# Patient Record
Sex: Male | Born: 1965 | Race: Asian | Hispanic: No | Marital: Married | Smoking: Never smoker
Health system: Southern US, Community
[De-identification: ages and names within clinical notes are randomized; demographics above are authoritative.]

## PROBLEM LIST (undated history)

## (undated) DIAGNOSIS — I10 Essential (primary) hypertension: Secondary | ICD-10-CM

## (undated) HISTORY — PX: WISDOM TOOTH EXTRACTION: SHX21

## (undated) HISTORY — PX: HERNIA REPAIR: SHX51

---

## 2019-07-16 ENCOUNTER — Other Ambulatory Visit: Payer: Self-pay

## 2019-07-16 DIAGNOSIS — Z20822 Contact with and (suspected) exposure to covid-19: Secondary | ICD-10-CM

## 2019-07-18 LAB — NOVEL CORONAVIRUS, NAA: SARS-CoV-2, NAA: NOT DETECTED

## 2019-11-17 ENCOUNTER — Ambulatory Visit: Payer: Self-pay

## 2021-07-01 ENCOUNTER — Ambulatory Visit: Payer: Self-pay | Admitting: Orthopedic Surgery

## 2021-07-26 ENCOUNTER — Encounter (HOSPITAL_COMMUNITY): Payer: Self-pay

## 2021-07-26 NOTE — Patient Instructions (Addendum)
DUE TO COVID-19 ONLY ONE VISITOR IS ALLOWED TO COME WITH YOU AND STAY IN THE WAITING ROOM ONLY DURING PRE OP AND PROCEDURE.   **NO VISITORS ARE ALLOWED IN THE SHORT STAY AREA OR RECOVERY ROOM!!**  IF YOU WILL BE ADMITTED INTO THE HOSPITAL YOU ARE ALLOWED ONLY TWO SUPPORT PEOPLE DURING VISITATION HOURS ONLY (7 AM -8PM)    Up to two visitors ages 38+ are allowed at one time in a patient's room.  The visitors may rotate out with other people throughout the day.  Additionally, up to two children between the ages of 58 and 45 are allowed and do not count toward the number of allowed visitors.  Children within this age range must be accompanied by an adult visitor.  One adult visitor may remain with the patient overnight and must be in the room by 8 PM.  COVID SWAB TESTING MUST BE COMPLETED ON:  Wednesday, 08-11-21, Between the hours of 8 and 3  **MUST PRESENT COMPLETED FORM AT TESTING SITE**    [address] [place] (backside of the building)  You are not required to quarantine, however you are required to wear a well-fitted mask when you are out and around people not in your household.  Hand Hygiene often Do NOT share personal items Notify your provider if you are in close contact with someone who has COVID or you develop fever 100.4 or greater, new onset of sneezing, cough, sore throat, shortness of breath or body aches.        Your procedure is scheduled on:  Friday, 08-13-21   Report to [facility] Main  Entrance     Report to admitting at 11:00 AM   Call this number if you have problems the morning of surgery [phone]   Do not eat food :After Midnight.   May have liquids until 10:15 AM day of surgery  CLEAR LIQUID DIET  Foods Allowed                                                                     Foods Excluded  Water, Black Coffee (no milk/no creamer) and tea, regular and decaf                              liquids that you cannot  Plain Jell-O in  any flavor  (No red)                         see through such as: Fruit ices (not with fruit pulp)                                 milk, soups, orange juice  Iced Popsicles (No red)                                    All solid food                             Apple juices  Sports drinks like Gatorade (No red) Lightly seasoned clear broth or consume(fat free) Sugar    Complete one Ensure drink the morning of surgery at  10:15 AM the day of surgery.     The day of surgery:  Drink ONE (1) Pre-Surgery Clear Ensure the morning of surgery. Drink in one sitting. Do not sip.  This drink was given to you during your hospital  pre-op appointment visit. Nothing else to drink after completing the Pre-Surgery Clear Ensure .          If you have questions, please contact your surgeon's office.     Oral Hygiene is also important to reduce your risk of infection.                                    Remember - BRUSH YOUR TEETH THE MORNING OF SURGERY WITH YOUR REGULAR TOOTHPASTE   Do NOT smoke after Midnight   Take these medicines the morning of surgery with A SIP OF WATER:  Metoprolol   Stop all vitamins and herbal supplements a week before surgery             You may not have any metal on your body including  jewelry, and body piercing             Do not wear  lotions, powders, cologne, or deodorant  Do not shave  48 hours prior to surgery.               Men may shave face and neck.  Do not bring valuables to the hospital. [facility] IS NOT RESPONSIBLE FOR VALUABLES.   Contacts, dentures or bridgework may not be worn into surgery.   Bring small overnight bag day of surgery.   Please read over the following fact sheets you were given: IF YOU HAVE QUESTIONS ABOUT YOUR PRE OP INSTRUCTIONS PLEASE CALL [phone] [facility] - Preparing for Surgery Before surgery, you can play an important role.  Because skin is not sterile, your skin needs to be as free of germs as possible.  You  can reduce the number of germs on your skin by washing with CHG (chlorahexidine gluconate) soap before surgery.  CHG is an antiseptic cleaner which kills germs and bonds with the skin to continue killing germs even after washing. Please DO NOT use if you have an allergy to CHG or antibacterial soaps.  If your skin becomes reddened/irritated stop using the CHG and inform your nurse when you arrive at Short Stay. Do not shave (including legs and underarms) for at least 48 hours prior to the first CHG shower.  You may shave your face/neck.  Please follow these instructions carefully:  1.  Shower with CHG Soap the night before surgery and the  morning of surgery.  2.  If you choose to wash your hair, wash your hair first as usual with your normal  shampoo.  3.  After you shampoo, rinse your hair and body thoroughly to remove the shampoo.                             4.  Use CHG as you would any other liquid soap.  You can apply chg directly to the skin and wash.  Gently with a scrungie or clean washcloth.  5.  Apply the CHG Soap to your body  ONLY FROM THE NECK DOWN.   Do   not use on face/ open                           Wound or open sores. Avoid contact with eyes, ears mouth and   genitals (private parts).                       Wash face,  Genitals (private parts) with your normal soap.             6.  Wash thoroughly, paying special attention to the area where your    surgery  will be performed.  7.  Thoroughly rinse your body with warm water from the neck down.  8.  DO NOT shower/wash with your normal soap after using and rinsing off the CHG Soap.                9.  Pat yourself dry with a clean towel.            10.  Wear clean pajamas.            11.  Place clean sheets on your bed the night of your first shower and do not  sleep with pets. Day of Surgery : Do not apply any lotions/deodorants the morning of surgery.  Please wear clean clothes to the hospital/surgery center.  FAILURE TO FOLLOW  THESE INSTRUCTIONS MAY RESULT IN THE CANCELLATION OF YOUR SURGERY  PATIENT SIGNATURE_________________________________  NURSE SIGNATURE__________________________________  ________________________________________________________________________   Rogelia Mire  An incentive spirometer is a tool that can help keep your lungs clear and active. This tool measures how well you are filling your lungs with each breath. Taking long deep breaths may help reverse or decrease the chance of developing breathing (pulmonary) problems (especially infection) following: A long period of time when you are unable to move or be active. BEFORE THE PROCEDURE  If the spirometer includes an indicator to show your best effort, your nurse or respiratory therapist will set it to a desired goal. If possible, sit up straight or lean slightly forward. Try not to slouch. Hold the incentive spirometer in an upright position. INSTRUCTIONS FOR USE  Sit on the edge of your bed if possible, or sit up as far as you can in bed or on a chair. Hold the incentive spirometer in an upright position. Breathe out normally. Place the mouthpiece in your mouth and seal your lips tightly around it. Breathe in slowly and as deeply as possible, raising the piston or the ball toward the top of the column. Hold your breath for 3-5 seconds or for as long as possible. Allow the piston or ball to fall to the bottom of the column. Remove the mouthpiece from your mouth and breathe out normally. Rest for a few seconds and repeat Steps 1 through 7 at least 10 times every 1-2 hours when you are awake. Take your time and take a few normal breaths between deep breaths. The spirometer may include an indicator to show your best effort. Use the indicator as a goal to work toward during each repetition. After each set of 10 deep breaths, practice coughing to be sure your lungs are clear. If you have an incision (the cut made at the time of surgery),  support your incision when coughing by placing a pillow or rolled up towels firmly against it. Once you are able to get out  of bed, walk around indoors and cough well. You may stop using the incentive spirometer when instructed by your caregiver.  RISKS AND COMPLICATIONS Take your time so you do not get dizzy or light-headed. If you are in pain, you may need to take or ask for pain medication before doing incentive spirometry. It is harder to take a deep breath if you are having pain. AFTER USE Rest and breathe slowly and easily. It can be helpful to keep track of a log of your progress. Your caregiver can provide you with a simple table to help with this. If you are using the spirometer at home, follow these instructions: SEEK MEDICAL CARE IF:  You are having difficultly using the spirometer. You have trouble using the spirometer as often as instructed. Your pain medication is not giving enough relief while using the spirometer. You develop fever of 100.5 F (38.1 C) or higher. SEEK IMMEDIATE MEDICAL CARE IF:  You cough up bloody sputum that had not been present before. You develop fever of 102 F (38.9 C) or greater. You develop worsening pain at or near the incision site. MAKE SURE YOU:  Understand these instructions. Will watch your condition. Will get help right away if you are not doing well or get worse. Document Released: 01/30/2007 Document Revised: 12/12/2011 Document Reviewed: 04/02/2007 [facility] Patient Information 2014 [place], [place].   ________________________________________________________________________

## 2021-07-27 ENCOUNTER — Other Ambulatory Visit: Payer: Self-pay

## 2021-07-27 ENCOUNTER — Encounter (HOSPITAL_COMMUNITY): Payer: Self-pay

## 2021-07-27 ENCOUNTER — Encounter (HOSPITAL_COMMUNITY)
Admission: RE | Admit: 2021-07-27 | Discharge: 2021-07-27 | Disposition: A | Discharge status: Home / Self Care | Payer: BC Managed Care – PPO | Source: Ambulatory Visit | Attending: Specialist | Admitting: Specialist

## 2021-07-27 DIAGNOSIS — Z01818 Encounter for other preprocedural examination: Secondary | ICD-10-CM | POA: Insufficient documentation

## 2021-07-27 HISTORY — DX: Essential (primary) hypertension: I10

## 2021-07-27 LAB — CBC WITH DIFFERENTIAL/PLATELET
Abs Immature Granulocytes: 0.03 10*3/uL (ref 0.00–0.07)
Basophils Absolute: 0 10*3/uL (ref 0.0–0.1)
Basophils Relative: 0 %
Eosinophils Absolute: 0 10*3/uL (ref 0.0–0.5)
Eosinophils Relative: 0 %
HCT: 49.3 % (ref 39.0–52.0)
Hemoglobin: 17.1 g/dL — ABNORMAL HIGH (ref 13.0–17.0)
Immature Granulocytes: 0 %
Lymphocytes Relative: 21 %
Lymphs Abs: 1.5 10*3/uL (ref 0.7–4.0)
MCH: 33 pg (ref 26.0–34.0)
MCHC: 34.7 g/dL (ref 30.0–36.0)
MCV: 95.2 fL (ref 80.0–100.0)
Monocytes Absolute: 0.4 10*3/uL (ref 0.1–1.0)
Monocytes Relative: 6 %
Neutro Abs: 5.1 10*3/uL (ref 1.7–7.7)
Neutrophils Relative %: 73 %
Platelets: 273 10*3/uL (ref 150–400)
RBC: 5.18 MIL/uL (ref 4.22–5.81)
RDW: 11.9 % (ref 11.5–15.5)
WBC: 7.1 10*3/uL (ref 4.0–10.5)
nRBC: 0 % (ref 0.0–0.2)

## 2021-07-27 LAB — BASIC METABOLIC PANEL
Anion gap: 7 (ref 5–15)
BUN: 26 mg/dL — ABNORMAL HIGH (ref 6–20)
CO2: 30 mmol/L (ref 22–32)
Calcium: 9.7 mg/dL (ref 8.9–10.3)
Chloride: 100 mmol/L (ref 98–111)
Creatinine, Ser: 1.04 mg/dL (ref 0.61–1.24)
GFR, Estimated: >60 mL/min (ref >60)
Glucose, Bld: 111 mg/dL — ABNORMAL HIGH (ref 70–99)
Potassium: 4.3 mmol/L (ref 3.5–5.1)
Sodium: 137 mmol/L (ref 135–145)

## 2021-07-27 NOTE — Progress Notes (Addendum)
COVID swab appointment: 08-11-21  COVID Vaccine Completed: No Date COVID Vaccine completed: Has received booster: COVID vaccine manufacturer: Pfizer    [employer]   Date of COVID positive in last 90 days: No  PCP - Joycelyn Rua, MD Cardiologist - N/A  Chest x-ray - N/A EKG - 07-27-21 Epic Stress Test - N/A ECHO - N/A Cardiac Cath - N/A Pacemaker/ICD device last checked: Spinal Cord Stimulator:  Sleep Study - N/A CPAP -   Fasting Blood Sugar - N/A Checks Blood Sugar _____ times a day  Blood Thinner Instructions:N/A Aspirin Instructions: Last Dose:  Activity level:  Can go up a flight of stairs and perform activities of daily living without stopping and without symptoms of chest pain or shortness of breath.  Able to exercise without symptoms  Anesthesia review: N/A  Patient denies shortness of breath, fever, cough and chest pain at PAT appointment   Patient verbalized understanding of instructions that were given to them at the PAT appointment. Patient was also instructed that they will need to review over the PAT instructions again at home before surgery.

## 2021-08-05 ENCOUNTER — Ambulatory Visit: Payer: Self-pay | Admitting: Orthopedic Surgery

## 2021-08-05 NOTE — H&P (Signed)
Jonatha Hernandez is an 55 y.o. male.   Chief Complaint: left shoulder pain HPI: Reason for Visit: Diagnositc Results (left shoulder MRI) Context: The patient is one year out Location (Upper Extremity): shoulder pain on the left Severity: pain level 0/10 Timing: constant Associated Symptoms: no popping/clicking; no locking Medications: The patient is not taking any medications for the shoulder.  Past Medical History:  Diagnosis Date   Hypertension     Past Surgical History:  Procedure Laterality Date   HERNIA REPAIR     WISDOM TOOTH EXTRACTION      No family history on file. Social History:  reports that he has never smoked. He has never used smokeless tobacco. He reports current alcohol use. He reports that he does not use drugs.  Allergies: No Known Allergies  Current medications: lisinopriL 20 mg-hydrochlorothiazide 12.5 mg tablet metoprolol tartrate 50 mg tablet  Review of Systems  Constitutional: Negative.   HENT: Negative.    Eyes: Negative.   Respiratory: Negative.    Cardiovascular: Negative.   Gastrointestinal: Negative.   Endocrine: Negative.   Genitourinary: Negative.   Musculoskeletal:  Positive for arthralgias and myalgias.  Neurological: Negative.   Psychiatric/Behavioral: Negative.     There were no vitals taken for this visit. Physical Exam Constitutional:      Appearance: Normal appearance.  HENT:     Head: Normocephalic.     Right Ear: External ear normal.     Left Ear: External ear normal.     Nose: Nose normal.     Mouth/Throat:     Mouth: Mucous membranes are moist.  Eyes:     Conjunctiva/sclera: Conjunctivae normal.  Cardiovascular:     Rate and Rhythm: Normal rate and regular rhythm.     Pulses: Normal pulses.  Pulmonary:     Effort: Pulmonary effort is normal.  Abdominal:     General: Bowel sounds are normal.  Musculoskeletal:     Cervical back: Normal range of motion.     Comments: Constitutional: General Appearance: healthy-appearing  and NAD.  Psychiatric: Mood and Affect: normal mood and affect.  Cardiovascular System: Arterial Pulses Left: radial normal and brachial normal. Edema Left: none. Varicosities Right: no varicosities. Varicosities Left: no varicosities.  C-Spine/Neck: Active Range of Motion: flexion normal, extension normal, and no pain elicited on motion.  Shoulders: Inspection Left: no misalignment, atrophy, erythema, swelling, or scapular winging. Bony Palpation Left: no tenderness of the sternoclavicular joint, the coracoid process, the acromioclavicular joint, the bicipital groove, or the scapula. Soft Tissue Palpation Left: no tenderness of the infraspinatus, the teres minor, the subacromial bursa, the axilla, the glenohumeral joint region, the pectoralis major insertion, the sternocleidomastoid, the costochondral junction, the trapezius, the rhomboid, the latissimus dorsi, the serratus, the deltoid, the levator scapulae, or the lateral cuff insertion and tenderness of the supraspinatus and the subdeltoid bursa. Active Range of Motion Left: limited. Special Tests Left: Speed's test negative and Neer's test positive. Stability Left: no laxity, sulcus sign negative, and anterior apprehension test negative. Strength Left: abduction 5/5, adduction 5/5, flexion 5/5, and extension 5/5.  Skin: Left Upper Extremity: normal.  Neurological System: Biceps Reflex Left: normal (2). Brachioradialis Reflex Left: normal (2). Triceps Reflex Left: normal (2). Sensation on the Left: C5 normal, C6 normal, and C7 normal.  Skin:    General: Skin is warm and dry.  Neurological:     Mental Status: He is alert.    MRI of the left shoulder indicates evidence of impingement syndrome. Also a 10 x  13 mm crescent-shaped full-thickness supraspinatus footprint tear. A blunt torn superior labrum. Mild glenohumeral effusion. Mild to moderate AC arthrosis. Thickened CA ligament  Assessment/Plan Impression:  Left shoulder pain due to  full-thickness supraspinatus rotator cuff tear associated impingement syndrome thickened CA ligament possible labral tear Possible labral tearing  Plan:  An extensive discussion concerning the pathology relevant anatomy and treatment options. After that discussion we mutually agreed to proceed with repair of the rotator cuff utilizing arthroscopic assistance if possible. The risks and benefits of that procedure were discussed including bleeding, infection, suboptimal range of motion, deep venous thrombosis, pulmonary embolism, anesthetic complications etc. in addition we discussed the postoperative course to include approximately 4 weeks of passive range of motion followed by 4 weeks of active range of motion followed by 4-12 weeks of progressive strengthening exercises. In addition we discussed protective activities to reduce the risk of a reinjury including impingement activities with elbow above the shoulder as well as reaching and repetitive circular motion activities. The hospital stay will either be as a outpatient with a regional block versus overnight depending upon the extent of the procedure and any challenging health issues with a first postoperative visit 2 weeks following the surgery. No history of MRSA. Not allergic to penicillin. Able to take aspirin. And oxycodone.  Plan left shoulder arthroscopy, SAD, mini-open RCR, possible labral debridement  Dorothy Spark, PA-C for Dr Shelle Iron 08/05/2021, 1:52 PM

## 2021-08-05 NOTE — H&P (View-Only) (Signed)
Brian Hernandez is an 55 y.o. male.   Chief Complaint: left shoulder pain HPI: Reason for Visit: Diagnositc Results (left shoulder MRI) Context: The patient is one year out Location (Upper Extremity): shoulder pain on the left Severity: pain level 0/10 Timing: constant Associated Symptoms: no popping/clicking; no locking Medications: The patient is not taking any medications for the shoulder.  Past Medical History:  Diagnosis Date   Hypertension     Past Surgical History:  Procedure Laterality Date   HERNIA REPAIR     WISDOM TOOTH EXTRACTION      No family history on file. Social History:  reports that he has never smoked. He has never used smokeless tobacco. He reports current alcohol use. He reports that he does not use drugs.  Allergies: No Known Allergies  Current medications: lisinopriL 20 mg-hydrochlorothiazide 12.5 mg tablet metoprolol tartrate 50 mg tablet  Review of Systems  Constitutional: Negative.   HENT: Negative.    Eyes: Negative.   Respiratory: Negative.    Cardiovascular: Negative.   Gastrointestinal: Negative.   Endocrine: Negative.   Genitourinary: Negative.   Musculoskeletal:  Positive for arthralgias and myalgias.  Neurological: Negative.   Psychiatric/Behavioral: Negative.     There were no vitals taken for this visit. Physical Exam Constitutional:      Appearance: Normal appearance.  HENT:     Head: Normocephalic.     Right Ear: External ear normal.     Left Ear: External ear normal.     Nose: Nose normal.     Mouth/Throat:     Mouth: Mucous membranes are moist.  Eyes:     Conjunctiva/sclera: Conjunctivae normal.  Cardiovascular:     Rate and Rhythm: Normal rate and regular rhythm.     Pulses: Normal pulses.  Pulmonary:     Effort: Pulmonary effort is normal.  Abdominal:     General: Bowel sounds are normal.  Musculoskeletal:     Cervical back: Normal range of motion.     Comments: Constitutional: General Appearance: healthy-appearing  and NAD.  Psychiatric: Mood and Affect: normal mood and affect.  Cardiovascular System: Arterial Pulses Left: radial normal and brachial normal. Edema Left: none. Varicosities Right: no varicosities. Varicosities Left: no varicosities.  C-Spine/Neck: Active Range of Motion: flexion normal, extension normal, and no pain elicited on motion.  Shoulders: Inspection Left: no misalignment, atrophy, erythema, swelling, or scapular winging. Bony Palpation Left: no tenderness of the sternoclavicular joint, the coracoid process, the acromioclavicular joint, the bicipital groove, or the scapula. Soft Tissue Palpation Left: no tenderness of the infraspinatus, the teres minor, the subacromial bursa, the axilla, the glenohumeral joint region, the pectoralis major insertion, the sternocleidomastoid, the costochondral junction, the trapezius, the rhomboid, the latissimus dorsi, the serratus, the deltoid, the levator scapulae, or the lateral cuff insertion and tenderness of the supraspinatus and the subdeltoid bursa. Active Range of Motion Left: limited. Special Tests Left: Speed's test negative and Neer's test positive. Stability Left: no laxity, sulcus sign negative, and anterior apprehension test negative. Strength Left: abduction 5/5, adduction 5/5, flexion 5/5, and extension 5/5.  Skin: Left Upper Extremity: normal.  Neurological System: Biceps Reflex Left: normal (2). Brachioradialis Reflex Left: normal (2). Triceps Reflex Left: normal (2). Sensation on the Left: C5 normal, C6 normal, and C7 normal.  Skin:    General: Skin is warm and dry.  Neurological:     Mental Status: He is alert.    MRI of the left shoulder indicates evidence of impingement syndrome. Also a 10 x  13 mm crescent-shaped full-thickness supraspinatus footprint tear. A blunt torn superior labrum. Mild glenohumeral effusion. Mild to moderate AC arthrosis. Thickened CA ligament  Assessment/Plan Impression:  Left shoulder pain due to  full-thickness supraspinatus rotator cuff tear associated impingement syndrome thickened CA ligament possible labral tear Possible labral tearing  Plan:  An extensive discussion concerning the pathology relevant anatomy and treatment options. After that discussion we mutually agreed to proceed with repair of the rotator cuff utilizing arthroscopic assistance if possible. The risks and benefits of that procedure were discussed including bleeding, infection, suboptimal range of motion, deep venous thrombosis, pulmonary embolism, anesthetic complications etc. in addition we discussed the postoperative course to include approximately 4 weeks of passive range of motion followed by 4 weeks of active range of motion followed by 4-12 weeks of progressive strengthening exercises. In addition we discussed protective activities to reduce the risk of a reinjury including impingement activities with elbow above the shoulder as well as reaching and repetitive circular motion activities. The hospital stay will either be as a outpatient with a regional block versus overnight depending upon the extent of the procedure and any challenging health issues with a first postoperative visit 2 weeks following the surgery. No history of MRSA. Not allergic to penicillin. Able to take aspirin. And oxycodone.  Plan left shoulder arthroscopy, SAD, mini-open RCR, possible labral debridement  Dorothy Spark, PA-C for Dr Shelle Iron 08/05/2021, 1:52 PM

## 2021-08-11 ENCOUNTER — Other Ambulatory Visit: Payer: Self-pay | Admitting: Specialist

## 2021-08-11 LAB — SARS CORONAVIRUS 2 (TAT 6-24 HRS): SARS Coronavirus 2: NEGATIVE

## 2021-08-13 ENCOUNTER — Ambulatory Visit (HOSPITAL_COMMUNITY)
Admission: RE | Admit: 2021-08-13 | Discharge: 2021-08-13 | Disposition: A | Discharge status: Home / Self Care | Payer: BC Managed Care – PPO | Attending: Specialist | Admitting: Specialist

## 2021-08-13 ENCOUNTER — Ambulatory Visit (HOSPITAL_COMMUNITY): Payer: BC Managed Care – PPO | Admitting: Anesthesiology

## 2021-08-13 ENCOUNTER — Encounter (HOSPITAL_COMMUNITY)
Admission: RE | Disposition: A | Discharge status: Home / Self Care | Payer: Self-pay | Source: Home / Self Care | Attending: Specialist

## 2021-08-13 ENCOUNTER — Encounter (HOSPITAL_COMMUNITY): Payer: Self-pay | Admitting: Specialist

## 2021-08-13 DIAGNOSIS — S43432A Superior glenoid labrum lesion of left shoulder, initial encounter: Secondary | ICD-10-CM | POA: Diagnosis not present

## 2021-08-13 DIAGNOSIS — X58XXXA Exposure to other specified factors, initial encounter: Secondary | ICD-10-CM | POA: Insufficient documentation

## 2021-08-13 DIAGNOSIS — M7542 Impingement syndrome of left shoulder: Secondary | ICD-10-CM | POA: Diagnosis not present

## 2021-08-13 DIAGNOSIS — M75122 Complete rotator cuff tear or rupture of left shoulder, not specified as traumatic: Secondary | ICD-10-CM | POA: Insufficient documentation

## 2021-08-13 HISTORY — PX: SHOULDER ARTHROSCOPY WITH ROTATOR CUFF REPAIR AND SUBACROMIAL DECOMPRESSION: SHX5686

## 2021-08-13 SURGERY — SHOULDER ARTHROSCOPY WITH ROTATOR CUFF REPAIR AND SUBACROMIAL DECOMPRESSION
Anesthesia: Regional | Site: Shoulder | Laterality: Left

## 2021-08-13 MED ORDER — PHENYLEPHRINE 40 MCG/ML (10ML) SYRINGE FOR IV PUSH (FOR BLOOD PRESSURE SUPPORT)
PREFILLED_SYRINGE | INTRAVENOUS | Status: AC
  Filled 2021-08-13: qty 10

## 2021-08-13 MED ORDER — EPHEDRINE 5 MG/ML INJ
INTRAVENOUS | Status: AC
  Filled 2021-08-13: qty 5

## 2021-08-13 MED ORDER — FENTANYL CITRATE PF 50 MCG/ML IJ SOSY: 25.0000 ug | PREFILLED_SYRINGE | INTRAMUSCULAR | Status: DC | PRN

## 2021-08-13 MED ORDER — DEXAMETHASONE SODIUM PHOSPHATE 10 MG/ML IJ SOLN
INTRAMUSCULAR | Status: AC
  Filled 2021-08-13: qty 1

## 2021-08-13 MED ORDER — PHENYLEPHRINE HCL (PRESSORS) 10 MG/ML IV SOLN
INTRAVENOUS | Status: AC
  Filled 2021-08-13: qty 1

## 2021-08-13 MED ORDER — BUPIVACAINE-EPINEPHRINE (PF) 0.5% -1:200000 IJ SOLN
INTRAMUSCULAR | Status: AC
  Filled 2021-08-13: qty 30

## 2021-08-13 MED ORDER — BUPIVACAINE LIPOSOME 1.3 % IJ SUSP
INTRAMUSCULAR | Status: DC | PRN
  Administered 2021-08-13: 10 mL via PERINEURAL

## 2021-08-13 MED ORDER — CHLORHEXIDINE GLUCONATE 0.12 % MT SOLN
15.0000 mL | Freq: Once | OROMUCOSAL | Status: AC
  Administered 2021-08-13: 15 mL via OROMUCOSAL

## 2021-08-13 MED ORDER — ONDANSETRON HCL 4 MG/2ML IJ SOLN
INTRAMUSCULAR | Status: AC
  Filled 2021-08-13: qty 2

## 2021-08-13 MED ORDER — DOCUSATE SODIUM 100 MG PO CAPS: 100.0000 mg | ORAL_CAPSULE | Freq: Two times a day (BID) | ORAL | 1 refills | Status: AC | PRN

## 2021-08-13 MED ORDER — ASPIRIN EC 81 MG PO TBEC: 81.0000 mg | DELAYED_RELEASE_TABLET | Freq: Every day | ORAL | 1 refills | Status: AC

## 2021-08-13 MED ORDER — ORAL CARE MOUTH RINSE: 15.0000 mL | Freq: Once | OROMUCOSAL | Status: AC

## 2021-08-13 MED ORDER — CEFAZOLIN SODIUM-DEXTROSE 2-4 GM/100ML-% IV SOLN
2.0000 g | INTRAVENOUS | Status: AC
  Administered 2021-08-13: 2 g via INTRAVENOUS
  Filled 2021-08-13: qty 100

## 2021-08-13 MED ORDER — ROCURONIUM BROMIDE 10 MG/ML (PF) SYRINGE
PREFILLED_SYRINGE | INTRAVENOUS | Status: DC | PRN
  Administered 2021-08-13: 70 mg via INTRAVENOUS

## 2021-08-13 MED ORDER — FENTANYL CITRATE (PF) 250 MCG/5ML IJ SOLN
INTRAMUSCULAR | Status: AC
  Filled 2021-08-13: qty 5

## 2021-08-13 MED ORDER — EPHEDRINE SULFATE-NACL 50-0.9 MG/10ML-% IV SOSY
PREFILLED_SYRINGE | INTRAVENOUS | Status: DC | PRN
  Administered 2021-08-13 (×5): 5 mg via INTRAVENOUS

## 2021-08-13 MED ORDER — LIDOCAINE HCL (PF) 2 % IJ SOLN
INTRAMUSCULAR | Status: AC
  Filled 2021-08-13: qty 5

## 2021-08-13 MED ORDER — SUGAMMADEX SODIUM 200 MG/2ML IV SOLN
INTRAVENOUS | Status: DC | PRN
  Administered 2021-08-13: 200 mg via INTRAVENOUS

## 2021-08-13 MED ORDER — 0.9 % SODIUM CHLORIDE (POUR BTL) OPTIME
TOPICAL | Status: DC | PRN
  Administered 2021-08-13: 1000 mL

## 2021-08-13 MED ORDER — BUPIVACAINE-EPINEPHRINE 0.5% -1:200000 IJ SOLN
INTRAMUSCULAR | Status: DC | PRN
  Administered 2021-08-13: 30 mL

## 2021-08-13 MED ORDER — MIDAZOLAM HCL 2 MG/2ML IJ SOLN
INTRAMUSCULAR | Status: AC
  Filled 2021-08-13: qty 2

## 2021-08-13 MED ORDER — BUPIVACAINE HCL (PF) 0.5 % IJ SOLN
INTRAMUSCULAR | Status: DC | PRN
  Administered 2021-08-13: 15 mL via PERINEURAL

## 2021-08-13 MED ORDER — DEXAMETHASONE SODIUM PHOSPHATE 10 MG/ML IJ SOLN
INTRAMUSCULAR | Status: DC | PRN
  Administered 2021-08-13: 10 mg via INTRAVENOUS

## 2021-08-13 MED ORDER — LIDOCAINE 2% (20 MG/ML) 5 ML SYRINGE
INTRAMUSCULAR | Status: DC | PRN
  Administered 2021-08-13: 20 mg via INTRAVENOUS

## 2021-08-13 MED ORDER — CEPHALEXIN 500 MG PO CAPS: 500.0000 mg | ORAL_CAPSULE | Freq: Four times a day (QID) | ORAL | 1 refills | Status: AC

## 2021-08-13 MED ORDER — PROPOFOL 10 MG/ML IV BOLUS
INTRAVENOUS | Status: AC
  Filled 2021-08-13: qty 20

## 2021-08-13 MED ORDER — METHOCARBAMOL 500 MG PO TABS: 500.0000 mg | ORAL_TABLET | Freq: Three times a day (TID) | ORAL | 1 refills | Status: AC | PRN

## 2021-08-13 MED ORDER — SUCCINYLCHOLINE CHLORIDE 200 MG/10ML IV SOSY
PREFILLED_SYRINGE | INTRAVENOUS | Status: AC
  Filled 2021-08-13: qty 20

## 2021-08-13 MED ORDER — MIDAZOLAM HCL 2 MG/2ML IJ SOLN
INTRAMUSCULAR | Status: DC | PRN
  Administered 2021-08-13: 1 mg via INTRAVENOUS

## 2021-08-13 MED ORDER — ROCURONIUM BROMIDE 10 MG/ML (PF) SYRINGE
PREFILLED_SYRINGE | INTRAVENOUS | Status: AC
  Filled 2021-08-13: qty 10

## 2021-08-13 MED ORDER — LACTATED RINGERS IV SOLN
INTRAVENOUS | Status: DC | PRN
  Administered 2021-08-13: 6000 mL

## 2021-08-13 MED ORDER — LACTATED RINGERS IV SOLN: INTRAVENOUS | Status: DC

## 2021-08-13 MED ORDER — FENTANYL CITRATE PF 50 MCG/ML IJ SOSY
50.0000 ug | PREFILLED_SYRINGE | INTRAMUSCULAR | Status: DC
  Administered 2021-08-13: 50 ug via INTRAVENOUS
  Filled 2021-08-13: qty 2

## 2021-08-13 MED ORDER — ONDANSETRON HCL 4 MG/2ML IJ SOLN
INTRAMUSCULAR | Status: DC | PRN
  Administered 2021-08-13: 4 mg via INTRAVENOUS

## 2021-08-13 MED ORDER — MIDAZOLAM HCL 2 MG/2ML IJ SOLN
1.0000 mg | INTRAMUSCULAR | Status: DC
  Administered 2021-08-13 (×2): 1 mg via INTRAVENOUS
  Filled 2021-08-13: qty 2

## 2021-08-13 MED ORDER — PROPOFOL 1000 MG/100ML IV EMUL
INTRAVENOUS | Status: AC
  Filled 2021-08-13: qty 100

## 2021-08-13 MED ORDER — OXYCODONE HCL 5 MG PO TABS: 5.0000 mg | ORAL_TABLET | ORAL | 0 refills | Status: AC | PRN

## 2021-08-13 MED ORDER — PROPOFOL 10 MG/ML IV BOLUS
INTRAVENOUS | Status: DC | PRN
  Administered 2021-08-13: 130 mg via INTRAVENOUS

## 2021-08-13 MED ORDER — ACETAMINOPHEN 500 MG PO TABS
1000.0000 mg | ORAL_TABLET | Freq: Once | ORAL | Status: AC
  Administered 2021-08-13: 1000 mg via ORAL
  Filled 2021-08-13: qty 2

## 2021-08-13 MED ORDER — PHENYLEPHRINE HCL-NACL 20-0.9 MG/250ML-% IV SOLN
INTRAVENOUS | Status: DC | PRN
  Administered 2021-08-13: 25 ug/min via INTRAVENOUS

## 2021-08-13 MED ORDER — EPINEPHRINE PF 1 MG/ML IJ SOLN
INTRAMUSCULAR | Status: AC
  Filled 2021-08-13: qty 2

## 2021-08-13 SURGICAL SUPPLY — 66 items
ANCHOR NEEDLE 9/16 CIR SZ 8 (NEEDLE) IMPLANT
ANCHOR SWIVELOCK BIO 4.75X19.1 (Anchor) ×6 IMPLANT
BAG COUNTER SPONGE SURGICOUNT (BAG) IMPLANT
BLADE EXCALIBUR 4.0X13 (MISCELLANEOUS) ×2 IMPLANT
BLADE SURG SZ11 CARB STEEL (BLADE) IMPLANT
BURR OVAL 8 FLU 4.0X13 (MISCELLANEOUS) IMPLANT
CANNULA ACUFO 5X76 (CANNULA) ×2 IMPLANT
CLEANER TIP ELECTROSURG 2X2 (MISCELLANEOUS) IMPLANT
COVER SURGICAL LIGHT HANDLE (MISCELLANEOUS) ×2 IMPLANT
DISSECTOR  3.8MM X 13CM (MISCELLANEOUS)
DISSECTOR 3.5MM X 13CM (MISCELLANEOUS) IMPLANT
DISSECTOR 3.8MM X 13CM (MISCELLANEOUS) IMPLANT
DRAPE IMP U-DRAPE 54X76 (DRAPES) ×2 IMPLANT
DRAPE INCISE IOBAN 66X45 STRL (DRAPES) ×2 IMPLANT
DRAPE ORTHO SPLIT 77X108 STRL (DRAPES) ×2
DRAPE POUCH INSTRU U-SHP 10X18 (DRAPES) ×2 IMPLANT
DRAPE STERI 35X30 U-POUCH (DRAPES) ×2 IMPLANT
DRAPE SURG ORHT 6 SPLT 77X108 (DRAPES) ×2 IMPLANT
DRSG AQUACEL AG ADV 3.5X 4 (GAUZE/BANDAGES/DRESSINGS) IMPLANT
DRSG AQUACEL AG ADV 3.5X 6 (GAUZE/BANDAGES/DRESSINGS) ×2 IMPLANT
DRSG PAD ABDOMINAL 8X10 ST (GAUZE/BANDAGES/DRESSINGS) IMPLANT
DURAPREP 26ML APPLICATOR (WOUND CARE) ×2 IMPLANT
ELECT NEEDLE TIP 2.8 STRL (NEEDLE) ×2 IMPLANT
ELECT REM PT RETURN 15FT ADLT (MISCELLANEOUS) ×2 IMPLANT
FILTER STRAW (MISCELLANEOUS) ×4 IMPLANT
GAUZE 4X4 16PLY ~~LOC~~+RFID DBL (SPONGE) ×2 IMPLANT
GLOVE SRG 8 PF TXTR STRL LF DI (GLOVE) ×1 IMPLANT
GLOVE SURG POLYISO LF SZ7.5 (GLOVE) ×4 IMPLANT
GLOVE SURG POLYISO LF SZ8 (GLOVE) ×4 IMPLANT
GLOVE SURG UNDER POLY LF SZ7.5 (GLOVE) ×2 IMPLANT
GLOVE SURG UNDER POLY LF SZ8 (GLOVE) ×1
GOWN STRL REUS W/TWL XL LVL3 (GOWN DISPOSABLE) ×4 IMPLANT
KIT BASIN OR (CUSTOM PROCEDURE TRAY) ×2 IMPLANT
KIT TURNOVER KIT A (KITS) ×2 IMPLANT
MANIFOLD NEPTUNE II (INSTRUMENTS) ×2 IMPLANT
NEEDLE SCORPION MULTI FIRE (NEEDLE) ×2 IMPLANT
NEEDLE SPNL 18GX3.5 QUINCKE PK (NEEDLE) ×2 IMPLANT
PACK SHOULDER (CUSTOM PROCEDURE TRAY) ×2 IMPLANT
PORT APPOLLO RF 90DEGREE MULTI (SURGICAL WAND) ×2 IMPLANT
PROTECTOR NERVE ULNAR (MISCELLANEOUS) IMPLANT
RESTRAINT HEAD UNIVERSAL NS (MISCELLANEOUS) ×2 IMPLANT
SLING ARM IMMOBILIZER LRG (SOFTGOODS) IMPLANT
SLING ARM IMMOBILIZER MED (SOFTGOODS) IMPLANT
SLING ULTRA II L (ORTHOPEDIC SUPPLIES) ×2 IMPLANT
STRIP CLOSURE SKIN 1/2X4 (GAUZE/BANDAGES/DRESSINGS) ×2 IMPLANT
SUCTION FRAZIER HANDLE 12FR (TUBING) ×1
SUCTION TUBE FRAZIER 12FR DISP (TUBING) ×1 IMPLANT
SUT ETHIBOND NAB CT1 #1 30IN (SUTURE) ×2 IMPLANT
SUT ETHILON 4 0 PS 2 18 (SUTURE) ×2 IMPLANT
SUT FIBERWIRE #2 38 T-5 BLUE (SUTURE)
SUT PROLENE 3 0 PS 2 (SUTURE) ×2 IMPLANT
SUT TIGER TAPE 7 IN WHITE (SUTURE) IMPLANT
SUT VIC AB 0 CT1 27 (SUTURE)
SUT VIC AB 0 CT1 27XBRD ANTBC (SUTURE) IMPLANT
SUT VIC AB 1-0 CT2 27 (SUTURE) IMPLANT
SUT VIC AB 2-0 CT1 27 (SUTURE) ×1
SUT VIC AB 2-0 CT1 TAPERPNT 27 (SUTURE) ×1 IMPLANT
SUT VIC AB 2-0 CT2 27 (SUTURE) IMPLANT
SUT VICRYL 0 UR6 27IN ABS (SUTURE) ×2 IMPLANT
SUTURE FIBERWR #2 38 T-5 BLUE (SUTURE) IMPLANT
SYR 20ML LL LF (SYRINGE) ×2 IMPLANT
TAPE FIBER 2MM 7IN #2 BLUE (SUTURE) ×4 IMPLANT
TOWEL OR 17X26 10 PK STRL BLUE (TOWEL DISPOSABLE) ×2 IMPLANT
TUBING ARTHROSCOPY IRRIG 16FT (MISCELLANEOUS) ×2 IMPLANT
TUBING CONNECTING 10 (TUBING) ×4 IMPLANT
WIPE CHG CHLORHEXIDINE 2% (PERSONAL CARE ITEMS) ×2 IMPLANT

## 2021-08-13 NOTE — Anesthesia Preprocedure Evaluation (Addendum)
Anesthesia Evaluation  Patient identified by MRN, date of birth, ID band Patient awake    Reviewed: Allergy & Precautions, NPO status , Patient's Chart, lab work & pertinent test results, reviewed documented beta blocker date and time   Airway Mallampati: III  TM Distance: >3 FB Neck ROM: Full  Mouth opening: Limited Mouth Opening  Dental no notable dental hx. (+) Teeth Intact, Dental Advisory Given   Pulmonary neg pulmonary ROS,    Pulmonary exam normal breath sounds clear to auscultation       Cardiovascular hypertension, Pt. on medications and Pt. on home beta blockers Normal cardiovascular exam Rhythm:Regular Rate:Normal     Neuro/Psych negative neurological ROS  negative psych ROS   GI/Hepatic negative GI ROS, Neg liver ROS,   Endo/Other  negative endocrine ROS  Renal/GU negative Renal ROS  negative genitourinary   Musculoskeletal negative musculoskeletal ROS (+)   Abdominal   Peds  Hematology negative hematology ROS (+)   Anesthesia Other Findings   Reproductive/Obstetrics                            Anesthesia Physical Anesthesia Plan  ASA: 2  Anesthesia Plan: General and Regional   Post-op Pain Management:  Regional for Post-op pain   Induction: Intravenous  PONV Risk Score and Plan: 2 and Midazolam, Dexamethasone and Ondansetron  Airway Management Planned: Oral ETT  Additional Equipment:   Intra-op Plan:   Post-operative Plan: Extubation in OR  Informed Consent: I have reviewed the patients History and Physical, chart, labs and discussed the procedure including the risks, benefits and alternatives for the proposed anesthesia with the patient or authorized representative who has indicated his/her understanding and acceptance.     Dental advisory given  Plan Discussed with: CRNA  Anesthesia Plan Comments:         Anesthesia Quick Evaluation

## 2021-08-13 NOTE — Transfer of Care (Signed)
Immediate Anesthesia Transfer of Care Note  Patient: Brian Hernandez  Procedure(s) Performed: SHOULDER ARTHROSCOPY, SUBACROMIAL DECOMPRESSION, MINI OPEN ROTATOR CUFF REPAIR AND LABRAL DEBRIDEMENT (Left: Shoulder)  Patient Location: PACU  Anesthesia Type:General  Level of Consciousness: awake and alert   Airway & Oxygen Therapy: Patient Spontanous Breathing and Patient connected to face mask oxygen  Post-op Assessment: Report given to RN and Post -op Vital signs reviewed and stable  Post vital signs: Reviewed and stable  Last Vitals:  Vitals Value Taken Time  BP 143/97 08/13/21 1502  Temp    Pulse 58 08/13/21 1505  Resp 17 08/13/21 1505  SpO2 100 % 08/13/21 1505  Vitals shown include unvalidated device data.  Last Pain:  Vitals:   08/13/21 1111  TempSrc: Oral  PainSc:          Complications: No notable events documented.

## 2021-08-13 NOTE — Interval H&P Note (Signed)
History and Physical Interval Note:  08/13/2021 12:43 PM  Brian Hernandez  has presented today for surgery, with the diagnosis of Left shoulder rotator cuff tear.  The various methods of treatment have been discussed with the patient and family. After consideration of risks, benefits and other options for treatment, the patient has consented to  Procedure(s): SHOULDER ARTHROSCOPY, SUBACROMIAL DECOMPRESSION, MINI OPEN ROTATOR CUFF REPAIR AND POSSIBLE LABRAL DEBRIDEMENT (Left) as a surgical intervention.  The patient's history has been reviewed, patient examined, no change in status, stable for surgery.  I have reviewed the patient's chart and labs.  Questions were answered to the patient's satisfaction.     Javier Docker

## 2021-08-13 NOTE — Progress Notes (Signed)
Assisted Dr. Willette Alma with  Left Interscalene brachial plexus block. Side rails up, monitors on throughout procedure. See vital signs in flow sheet. Tolerated Procedure well.

## 2021-08-13 NOTE — Anesthesia Procedure Notes (Signed)
Procedure Name: Intubation Date/Time: 08/13/2021 1:01 PM Performed by: Minerva Ends, CRNA Pre-anesthesia Checklist: Patient identified, Emergency Drugs available, Suction available and Patient being monitored Patient Re-evaluated:Patient Re-evaluated prior to induction Oxygen Delivery Method: Circle System Utilized Preoxygenation: Pre-oxygenation with 100% oxygen Induction Type: IV induction Ventilation: Mask ventilation without difficulty Laryngoscope Size: Miller and 2 Grade View: Grade III Tube type: Oral Tube size: 7.0 mm Number of attempts: 1 Airway Equipment and Method: Stylet Placement Confirmation: ETT inserted through vocal cords under direct vision, positive ETCO2 and breath sounds checked- equal and bilateral Secured at: 21 cm Tube secured with: Tape Dental Injury: Teeth and Oropharynx as per pre-operative assessment  Difficulty Due To: Difficult Airway- due to anterior larynx, Difficult Airway- due to limited oral opening and Difficult Airway- due to dentition Future Recommendations: Recommend- induction with short-acting agent, and alternative techniques readily available Comments: Smooth IV induction Woodrum- intubation AM CRNA atraumatic- teeth and mouth as preop - bilta BS- pt with ETT leak at 20 cm- air added by Woodrum- pt head plcaed in foam head holder by PAC- pt with ETT leak- Glidescope inserted for view- cuff herniation- ETT advanced after air out of ETT- placed at 27 cm under glidescope guidance. + equal BS- no leaks----CONSIDER DIFFICULT INTUBATION- POOR VIEW WITH MILLER BLADE

## 2021-08-13 NOTE — Discharge Instructions (Signed)

## 2021-08-13 NOTE — Brief Op Note (Signed)
08/13/2021  3:01 PM  PATIENT:  Brian Hernandez  55 y.o. male  PRE-OPERATIVE DIAGNOSIS:  Left shoulder rotator cuff tear  POST-OPERATIVE DIAGNOSIS:  Left shoulder rotator cuff tear  PROCEDURE:  Procedure(s): SHOULDER ARTHROSCOPY, SUBACROMIAL DECOMPRESSION, MINI OPEN ROTATOR CUFF REPAIR AND LABRAL DEBRIDEMENT (Left)  SURGEON:  Surgeon(s) and Role:    Jene Every, MD - Primary  PHYSICIAN ASSISTANT:   ASSISTANTS: Bissell   ANESTHESIA:   general  EBL:  50 mL   BLOOD ADMINISTERED:none  DRAINS: none   LOCAL MEDICATIONS USED:  MARCAINE     SPECIMEN:  No Specimen  DISPOSITION OF SPECIMEN:  N/A  COUNTS:  YES  TOURNIQUET:  * No tourniquets in log *  DICTATION: .Other Dictation: Dictation Number [id number]  PLAN OF CARE: Discharge to home after PACU  PATIENT DISPOSITION:  PACU - hemodynamically stable.   Delay start of Pharmacological VTE agent (>24hrs) due to surgical blood loss or risk of bleeding: no

## 2021-08-13 NOTE — Anesthesia Procedure Notes (Signed)
Anesthesia Regional Block: Interscalene brachial plexus block   Pre-Anesthetic Checklist: , timeout performed,  Correct Patient, Correct Site, Correct Laterality,  Correct Procedure, Correct Position, site marked,  Risks and benefits discussed,  Surgical consent,  Pre-op evaluation,  At surgeon's request and post-op pain management  Laterality: Left  Prep: Maximum Sterile Barrier Precautions used, chloraprep       Needles:  Injection technique: Single-shot  Needle Type: Echogenic Stimulator Needle     Needle Length: 5cm  Needle Gauge: 22     Additional Needles:   Procedures:,,,, ultrasound used (permanent image in chart),,    Narrative:  Start time: 08/13/2021 11:45 AM End time: 08/13/2021 11:50 AM Injection made incrementally with aspirations every 5 mL.  Performed by: Personally  Anesthesiologist: Elmer Picker, MD  Additional Notes: Monitors applied. No increased pain on injection. No increased resistance to injection. Injection made in 5cc increments. Good needle visualization. Patient tolerated procedure well.

## 2021-08-14 NOTE — Op Note (Signed)
NAMEJODIE, LEINER MEDICAL RECORD NO: [id number] ACCOUNT NO: [id number] DATE OF BIRTH: 1966-07-25 FACILITY: [facility] LOCATION: [facility] PHYSICIAN: Javier Docker, MD  Operative Report   DATE OF PROCEDURE: 08/13/2021  PREOPERATIVE DIAGNOSES:  Rotator cuff tear, impingement syndrome, acute labral tear of the left shoulder.  POSTOPERATIVE DIAGNOSES:  Rotator cuff tear, impingement syndrome, labral tear of the left shoulder.  PROCEDURES PERFORMED: 1.  Left shoulder arthroscopy. 2.  Extensive debridement that included debridement of superior and anterior labral tear, biceps insertion, subdeltoid bursa, subacromial bursa. 3.  Acromioplasty with coracoacromial ligament release. 4.  Mini open rotator cuff repair utilizing SwiveLock suture anchors x3.  ANESTHESIA:  General with regional block.  ASSISTANT:  Andrez Grime, PA utilized for patient positioning, closure, holding and manipulation of the extremity.  INDICATIONS:  A 55 year old male with shoulder pain, rotator cuff tear and labral tear indicated for repair of the rotator cuff debridement and subacromial decompression.  Risks and benefits discussed including bleeding, infection, damage to  neurovascular structures, no change in symptoms, worsening symptoms, DVT, PE, anesthetic complications, etc.  TECHNIQUE:  The patient in supine beach chair position.  After induction of adequate regional anesthesia and general and 2 grams Kefzol, the left shoulder, precordial region and scapular region and axilla was prepped and draped in the usual sterile  fashion as well as the left upper extremity.  Surgical timeout was performed identifying the left arm.  A surgical marker utilized to delineate the acromion, AC joint and coracoid.  Then, a standard posterolateral portal was fashioned with a #11 blade.   With gentle traction applied to the extremity in the 70/30 position, we penetrated the capsule atraumatically.  The irrigant was utilized to insufflate  the joint to 65 mmHg.  Under direct visualization, an anterior portal was fashioned by localizing with  an 18-gauge needle, midway between the coracoid and the anterolateral aspect of the acromion in the interval.  I then made an incision through the skin with an 11 blade and then advanced a blunt cannula penetrating the capsule atraumatically.  Noted was  a tear of the anterior, superior and posterior labrum, degenerative fraying. Introduced a shaver and debrided that to a stable base.  A small portion of the base of the biceps was debrided as well.  The remainder of the biceps was intact.  There were  minor degenerative changes of the glenohumeral joint.  Subscap was unremarkable.  Biceps tendon was in its groove.  We noted a tear in the supraspinatus.  We lavaged the joint.  I redirected the camera in the subacromial space of the arm in 0/30  position.  I fashioned the anterolateral portal with a #11 blade through the skin only and triangulated in the subacromial space.  There was hypertrophic bursa.  I introduced a shaver and performed a bursectomy of subdeltoid subacromial.  Out towards the  coracoid, I used [place] to release the CA ligament.  A small portion of the anterior lateral aspect of the acromion was shaved with a shaver.  I identified a full tear in the rotator cuff and debrided its edges and the greater tuberosity.  I then  converted to a mini open repair with all instrumentation was then removed.  Portals were closed with 4-0 nylon simple suture.  A 3 cm incision was made over the anterior lateral aspect of the acromion after infiltration with Marcaine with epinephrine.   Subcutaneous tissue was dissected with electrocautery utilized to achieve hemostasis.  The raphae between the anterior and  lateral heads was divided in line with skin incision.  A self-retaining retractor was then placed.  Digitally lysed subacromial  adhesions, I used a 3 mm Kerrison to remove a small lateral spur.  I  then identified the tear in the rotator cuff supraspinatus, retracted it approximately 1.5 cm x 1.5 cm.  I debrided the edges of the tendon.  I decorticated the greater tuberosity  lateral to the articular surface with an AO elevator for good bleeding tissue.  I then configured the tendon by placing one SwiveLock anchor just lateral to the articular surface in the mid portion of the tendon of the apex of the tear, I used an awl.   This was then removed using SwiveLock double loaded with TigerTape.  I advanced it into the bone with good purchase.  I then used a Scorpion suture passer to pass two leaflets anteriorly and two leaflets posteriorly.  This was more of a side-to-side type  repair.  I then crossed them in a double row fashion, secured them in a second row of 2 SwiveLocks just over the outer aspect of the greater tuberosity fashioning a hole with an awl and threading the leaflets through a SwiveLock with the arms in the  neutral position without undue tension.  Both excellent resistance to pull out, redundant suture removed.  I had full coverage noted.  The remainder of the cuff was unremarkable.  I then copiously irrigated the wound.  I closed the raphae with 0 Vicryl  in interrupted figure-of-eight sutures, subcutaneous with 2-0 and the skin with Prolene.  Sterile dressing applied, placed in abduction pillow and a sling, extubated, and transported to the recovery room in satisfactory condition.  The patient tolerated the procedure well.  There were no complications.  Assistant Andrez Grime, [place].  Minimal blood loss.   NIK D: 08/13/2021 3:10:14 pm T: 08/14/2021 12:09:00 am  JOB: [id number]/ [id number]

## 2021-08-16 ENCOUNTER — Encounter (HOSPITAL_COMMUNITY): Payer: Self-pay | Admitting: Specialist

## 2021-08-16 NOTE — Anesthesia Postprocedure Evaluation (Signed)
Anesthesia Post Note  Patient: Karn Cassis  Procedure(s) Performed: SHOULDER ARTHROSCOPY, SUBACROMIAL DECOMPRESSION, MINI OPEN ROTATOR CUFF REPAIR AND LABRAL DEBRIDEMENT (Left: Shoulder)     Patient location during evaluation: PACU Anesthesia Type: Regional and General Level of consciousness: awake and alert Pain management: pain level controlled Vital Signs Assessment: post-procedure vital signs reviewed and stable Respiratory status: spontaneous breathing, nonlabored ventilation, respiratory function stable and patient connected to nasal cannula oxygen Cardiovascular status: blood pressure returned to baseline and stable Postop Assessment: no apparent nausea or vomiting Anesthetic complications: no   No notable events documented.  Last Vitals:  Vitals:   08/13/21 1545 08/13/21 1605  BP: (!) 160/98 (!) 154/96  Pulse: 60 62  Resp: 19 16  Temp: 36.4 C   SpO2: 98% 98%    Last Pain:  Vitals:   08/13/21 1605  TempSrc:   PainSc: 0-No pain                 Masiah Lewing L Markavious Micco

## 2021-12-20 ENCOUNTER — Ambulatory Visit: Payer: BC Managed Care – PPO | Admitting: Podiatrist

## 2021-12-22 ENCOUNTER — Other Ambulatory Visit: Payer: Self-pay

## 2021-12-22 ENCOUNTER — Ambulatory Visit: Payer: BC Managed Care – PPO | Admitting: Podiatry

## 2021-12-22 DIAGNOSIS — M7989 Other specified soft tissue disorders: Secondary | ICD-10-CM

## 2021-12-22 NOTE — Progress Notes (Signed)
?Subjective:  ?Patient ID: Brian Hernandez, male    DOB: November 10, 1965,  MRN: [id number] ? ?Chief Complaint  ?Patient presents with  ? Foot Pain  ?  Right foot   ? ? ?56 y.o. male presents with the above complaint.  Patient presents with complaint of 2 cyst 1 on the right plantar first MPJ as well as right midfoot.  Patient states that they have been present for quite some time.  It has progressive gotten worse.  Is causing some discomfort especially while driving.  He has not seen anyone else prior to seeing me.  He went to his primary care doctor he was unable to help with the removal.  He is set me over here for surgical consultation.  He states the pain is 4 out of 10 however he can get more troublesome than painful.  He would like to have it removed.  He denies any other acute complaints. ? ? ?Review of Systems: Negative except as noted in the HPI. Denies N/V/F/Ch. ? ?Past Medical History:  ?Diagnosis Date  ? Hypertension   ? ? ?Current Outpatient Medications:  ?  aspirin EC 81 MG tablet, Take 1 tablet (81 mg total) by mouth daily. Day after surgery, Disp: 60 tablet, Rfl: 1 ?  cephALEXin (KEFLEX) 500 MG capsule, Take 1 capsule (500 mg total) by mouth 4 (four) times daily. Starting 6 hours after scheduled surgery time, Disp: 6 capsule, Rfl: 1 ?  docusate sodium (COLACE) 100 MG capsule, Take 1 capsule (100 mg total) by mouth 2 (two) times daily as needed for mild constipation., Disp: 30 capsule, Rfl: 1 ?  lisinopril-hydrochlorothiazide (ZESTORETIC) 20-12.5 MG tablet, Take 1 tablet by mouth daily., Disp: , Rfl:  ?  methocarbamol (ROBAXIN) 500 MG tablet, Take 1 tablet (500 mg total) by mouth every 8 (eight) hours as needed for muscle spasms., Disp: 40 tablet, Rfl: 1 ?  metoprolol tartrate (LOPRESSOR) 50 MG tablet, Take 50 mg by mouth daily., Disp: , Rfl:  ?  oxyCODONE (OXY IR/ROXICODONE) 5 MG immediate release tablet, Take 1 tablet (5 mg total) by mouth every 4 (four) hours as needed for severe pain., Disp: 40 tablet, Rfl:  0 ? ?Social History  ? ?Tobacco Use  ?Smoking Status Never  ?Smokeless Tobacco Never  ? ? ?No Known Allergies ?Objective:  ?There were no vitals filed for this visit. ?There is no height or weight on file to calculate BMI. ?Constitutional Well developed. ?Well nourished.  ?Vascular Dorsalis pedis pulses palpable bilaterally. ?Posterior tibial pulses palpable bilaterally. ?Capillary refill normal to all digits.  ?No cyanosis or clubbing noted. ?Pedal hair growth normal.  ?Neurologic Normal speech. ?Oriented to person, place, and time. ?Epicritic sensation to light touch grossly present bilaterally.  ?Dermatologic Nails well groomed and normal in appearance. ?No open wounds. ?No skin lesions.  ?Orthopedic: Mild pain on palpation right first plantar submet 1 soft tissue mass.  Clinically it is single lobulated mobile positive transilluminates consistent with ganglion cyst. ?Right midfoot granuloma that is indurated and easily bled upon debridement.  ? ?Radiographs: None ?Assessment:  ? ?1. Soft tissue mass   ? ?Plan:  ?Patient was evaluated and treated and all questions answered. ? ?Right plantar first MPJ soft tissue mass and right plantar midfoot granuloma mass ?-All questions and concerns were discussed with the patient in extensive detail. ?-Given the presence of the soft tissue mass in on common spot I believe patient would benefit from an MRI evaluation to rule out extension into the first MPJ joint  or sesamoidal complex.  I discussed this with the patient once the MRI I can appreciate the extent of the soft tissue mass and if it is removable.  Patient states understanding to proceed with an MRI ?-He will be scheduled to get the MRI. ?-After the MRI I will be able to schedule him for surgery to remove both of the mass. ? ?No follow-ups on file.  ?

## 2022-01-06 ENCOUNTER — Ambulatory Visit (INDEPENDENT_AMBULATORY_CARE_PROVIDER_SITE_OTHER): Payer: BC Managed Care – PPO

## 2022-01-06 DIAGNOSIS — M7989 Other specified soft tissue disorders: Secondary | ICD-10-CM | POA: Diagnosis not present

## 2022-01-06 NOTE — Progress Notes (Signed)
Patient in office for an x-ray of the right foot. X-ray of the right completed with 3 standard views. X-rays have been save to chart and can be submitted for MRI approval along with office note. Advise patient to call the office with any questions, comments or concerns.  ?

## 2022-01-08 ENCOUNTER — Ambulatory Visit
Admission: RE | Admit: 2022-01-08 | Discharge: 2022-01-08 | Disposition: A | Discharge status: Home / Self Care | Payer: BC Managed Care – PPO | Source: Ambulatory Visit | Attending: Podiatry | Admitting: Podiatry

## 2022-01-08 DIAGNOSIS — M7989 Other specified soft tissue disorders: Secondary | ICD-10-CM

## 2022-01-19 ENCOUNTER — Ambulatory Visit: Payer: BC Managed Care – PPO | Admitting: Podiatry

## 2022-01-28 ENCOUNTER — Ambulatory Visit: Payer: BC Managed Care – PPO | Admitting: Podiatry

## 2022-01-28 ENCOUNTER — Telehealth: Payer: Self-pay

## 2022-01-28 DIAGNOSIS — M7989 Other specified soft tissue disorders: Secondary | ICD-10-CM

## 2022-01-28 NOTE — Telephone Encounter (Signed)
DOS 02/21/2022 ? ?EXC BENIGN LESION RT - [id number] ?EXC GANGLION CYST RT - 28090 ? ?[employer] ST EFFECTIVE DATE - 10/03/2021 ? 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 ?PER WEBSITE NO AUTH REQUIRED ? ?

## 2022-02-02 ENCOUNTER — Telehealth: Payer: Self-pay

## 2022-02-02 NOTE — Telephone Encounter (Signed)
Received email from [facility] stating he wanted to cancel his surgery with Dr. Allena Katz on 02/21/2022. He stated he just doesn't want to have surgery at this time. Notified Dr. Allena Katz and Aram Beecham w/[facility] ?

## 2022-02-03 NOTE — Progress Notes (Signed)
?Subjective:  ?Patient ID: Brian Hernandez, male    DOB: 07-Feb-1966,  MRN: [id number] ? ?Chief Complaint  ?Patient presents with  ? soft tissue mass  ?  Mri results   ? ? ?56 y.o. male presents with the above complaint.  Patient presents for follow-up of 6 cyst to the right plantar first MPJ and plantar midfoot mass.  Patient states is doing okay.  Is causing him still some discomfort.  He is here to go over the MRI. ? ? ?Review of Systems: Negative except as noted in the HPI. Denies N/V/F/Ch. ? ?Past Medical History:  ?Diagnosis Date  ? Hypertension   ? ? ?Current Outpatient Medications:  ?  aspirin EC 81 MG tablet, Take 1 tablet (81 mg total) by mouth daily. Day after surgery, Disp: 60 tablet, Rfl: 1 ?  cephALEXin (KEFLEX) 500 MG capsule, Take 1 capsule (500 mg total) by mouth 4 (four) times daily. Starting 6 hours after scheduled surgery time, Disp: 6 capsule, Rfl: 1 ?  docusate sodium (COLACE) 100 MG capsule, Take 1 capsule (100 mg total) by mouth 2 (two) times daily as needed for mild constipation., Disp: 30 capsule, Rfl: 1 ?  lisinopril-hydrochlorothiazide (ZESTORETIC) 20-12.5 MG tablet, Take 1 tablet by mouth daily., Disp: , Rfl:  ?  methocarbamol (ROBAXIN) 500 MG tablet, Take 1 tablet (500 mg total) by mouth every 8 (eight) hours as needed for muscle spasms., Disp: 40 tablet, Rfl: 1 ?  metoprolol tartrate (LOPRESSOR) 50 MG tablet, Take 50 mg by mouth daily., Disp: , Rfl:  ?  oxyCODONE (OXY IR/ROXICODONE) 5 MG immediate release tablet, Take 1 tablet (5 mg total) by mouth every 4 (four) hours as needed for severe pain., Disp: 40 tablet, Rfl: 0 ? ?Social History  ? ?Tobacco Use  ?Smoking Status Never  ?Smokeless Tobacco Never  ? ? ?No Known Allergies ?Objective:  ?There were no vitals filed for this visit. ?There is no height or weight on file to calculate BMI. ?Constitutional Well developed. ?Well nourished.  ?Vascular Dorsalis pedis pulses palpable bilaterally. ?Posterior tibial pulses palpable  bilaterally. ?Capillary refill normal to all digits.  ?No cyanosis or clubbing noted. ?Pedal hair growth normal.  ?Neurologic Normal speech. ?Oriented to person, place, and time. ?Epicritic sensation to light touch grossly present bilaterally.  ?Dermatologic Nails well groomed and normal in appearance. ?No open wounds. ?No skin lesions.  ?Orthopedic: Mild pain on palpation right first plantar submet 1 soft tissue mass.  Clinically it is single lobulated mobile positive transilluminates consistent with ganglion cyst. ?Right midfoot granuloma that is indurated and easily bled upon debridement.  ? ?Radiographs: 1. In the area of interest of the soft tissues plantar and medial to ?the great toe metatarsophalangeal joint and sesamoids, there is a ?multiloculated likely cystic collection favored to represent a ?ganglion extending from the junction of the great toe ?metatarsophalangeal joint and medial sesamoid. ?2. There is an additional smaller ganglion centered just lateral to ?the proximal shaft of the great toe proximal phalanx, appearing to ?extend from the plantar aspect of the great toe metatarsophalangeal ?joint or the articulation of the great toe metatarsal head and the ?junction of the medial and lateral sesamoids. ?3. Mild great toe metatarsophalangeal joint osteoarthritis. ?Assessment:  ? ?No diagnosis found. ? ?Plan:  ?Patient was evaluated and treated and all questions answered. ? ?Right plantar first MPJ soft tissue mass and right plantar midfoot granuloma mass ?-All questions and concerns were discussed with the patient in extensive detail. ?-He has failed all conservative  treatment options including immobilization stabilizing and reducing the mass with injection. ?-MRI was reviewed with the patient which shows soft tissue mass/cyst extending from the first MPJ joint.  I discussed that he would benefit from surgical excision of the mass.  Patient states understand like to proceed with surgical excision.   I encouraged him that he cannot put any weight on it afterwards as this will be a plantar incision.  He states understanding.  I discussed the preoperative intra and postoperative plan in extensive detail.  I will plan on removing both of the mass ?-Informed surgical risk consent was reviewed and read aloud to the patient.  I reviewed the films.  I have discussed my findings with the patient in great detail.  I have discussed all risks including but not limited to infection, stiffness, scarring, limp, disability, deformity, damage to blood vessels and nerves, numbness, poor healing, need for braces, arthritis, chronic pain, amputation, death.  All benefits and realistic expectations discussed in great detail.  I have made no promises as to the outcome.  I have provided realistic expectations.  I have offered the patient a 2nd opinion, which they have declined and assured me they preferred to proceed despite the risks ? ? ?No follow-ups on file.  ?

## 2022-03-02 ENCOUNTER — Encounter: Payer: BC Managed Care – PPO | Admitting: Podiatry

## 2022-03-16 ENCOUNTER — Encounter: Payer: BC Managed Care – PPO | Admitting: Podiatry

## 2022-11-14 ENCOUNTER — Other Ambulatory Visit: Payer: Self-pay | Admitting: Urology

## 2022-11-14 DIAGNOSIS — R972 Elevated prostate specific antigen [PSA]: Secondary | ICD-10-CM

## 2022-12-10 ENCOUNTER — Ambulatory Visit
Admission: RE | Admit: 2022-12-10 | Discharge: 2022-12-10 | Disposition: A | Discharge status: Home / Self Care | Payer: BC Managed Care – PPO | Source: Ambulatory Visit | Attending: Urology | Admitting: Urology

## 2022-12-10 DIAGNOSIS — R972 Elevated prostate specific antigen [PSA]: Secondary | ICD-10-CM

## 2022-12-10 MED ORDER — GADOPICLENOL 0.5 MMOL/ML IV SOLN
8.0000 mL | Freq: Once | INTRAVENOUS | Status: AC | PRN
  Administered 2022-12-10: 8 mL via INTRAVENOUS

## 2023-05-14 IMAGING — MR MR FOOT*R* W/O CM
4 of 5 series · 16 of 40 positions shown · non-contrast
Comparison: Right radiographs 01/06/2022

CLINICAL DATA: Right foot deep soft tissue mass. Bump on right
foot.

EXAM:
MRI OF THE RIGHT FOREFOOT WITHOUT CONTRAST
TECHNIQUE: Multiplanar, multisequence MR imaging of the right forefoot was
performed. No intravenous contrast was administered.

[Series 5: T1 · coronal · 3.0mm · 0.20mm/px · 3 of 32 slices shown (1 of 2)]
[im 4/32]
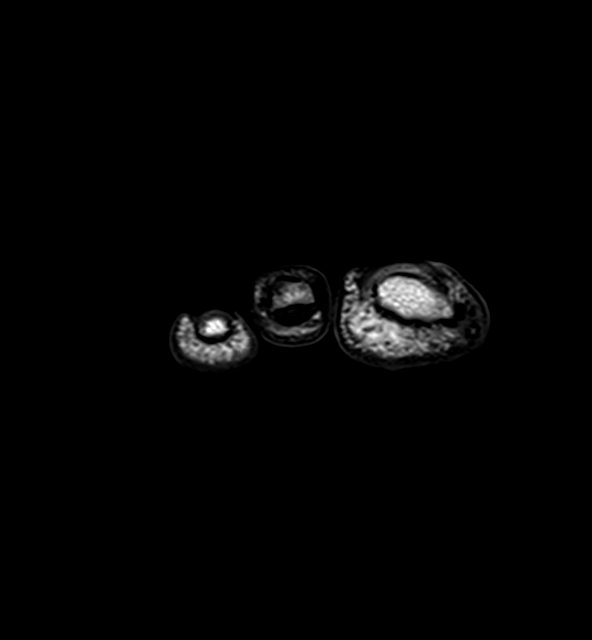
[im 18/32]
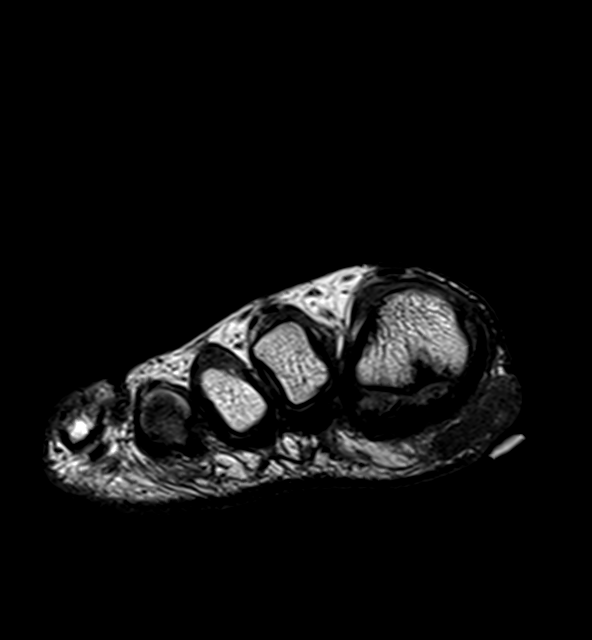
[im 28/32]
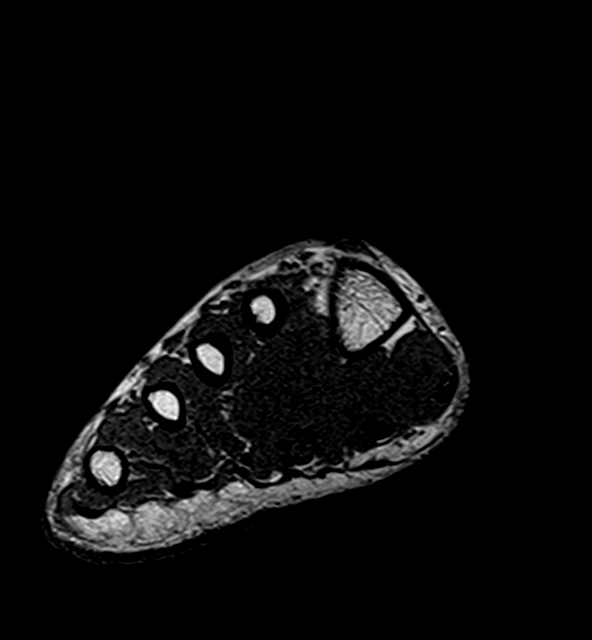

[Series 6: T2 fat-sat · coronal · 3.0mm · 0.20mm/px · 7 of 32 slices shown (1 of 2)]
[im 1/32]
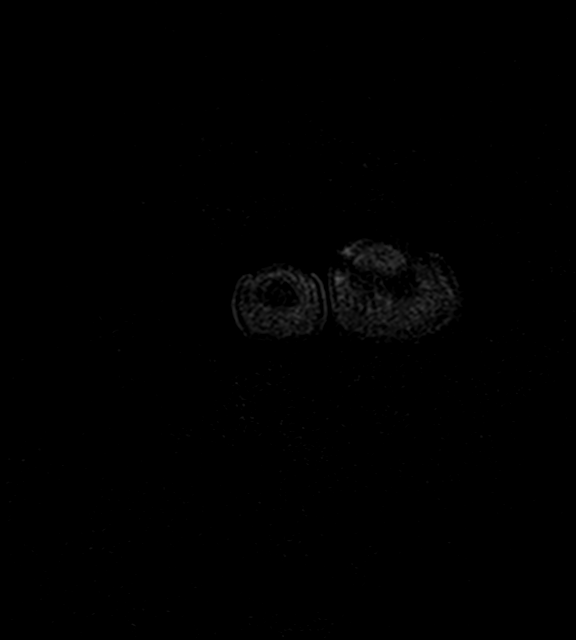
[im 4/32]
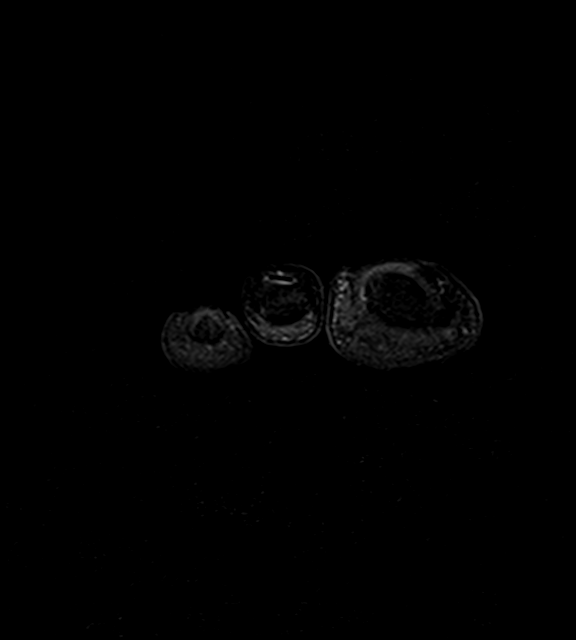
[im 11/32]
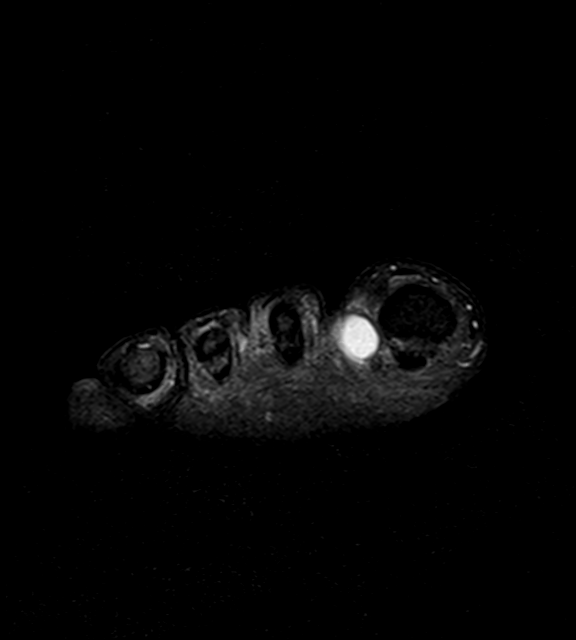
[im 14/32]
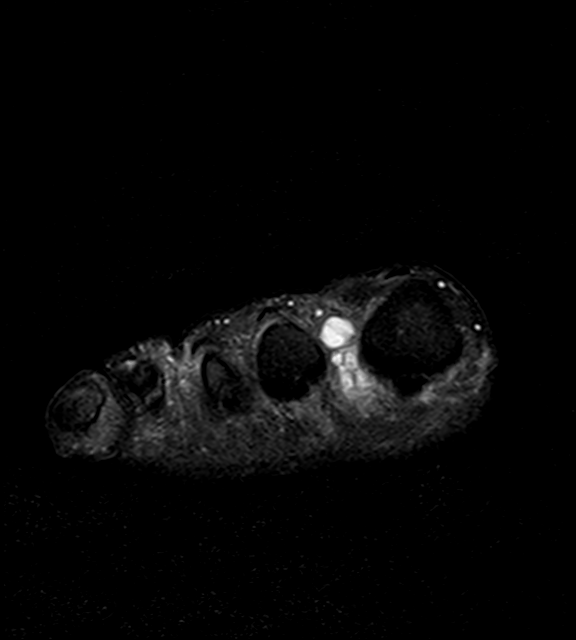
[im 18/32]
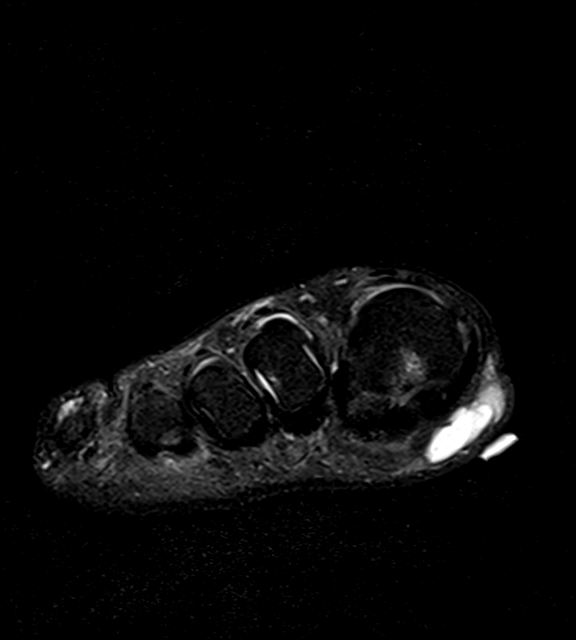
[im 21/32]
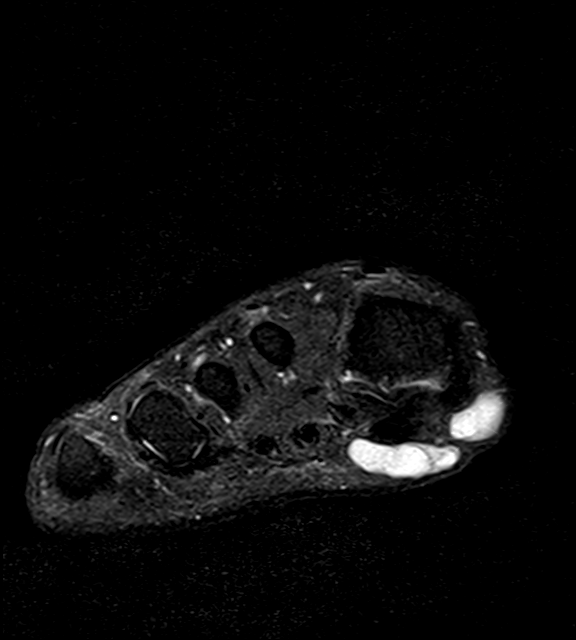
[im 28/32]
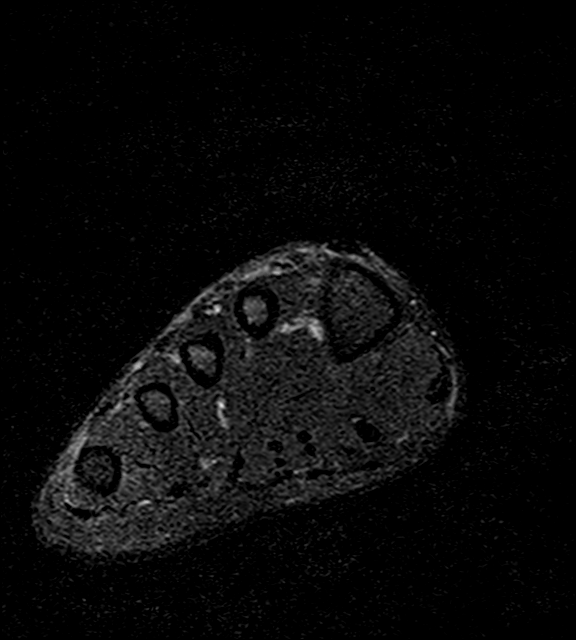

[Series 7: T2 fat-sat · axial · 3.0mm · 0.35mm/px · z∈[-71,-18]mm · 3 of 22 slices shown (2 of 2)]
[im 4/22]
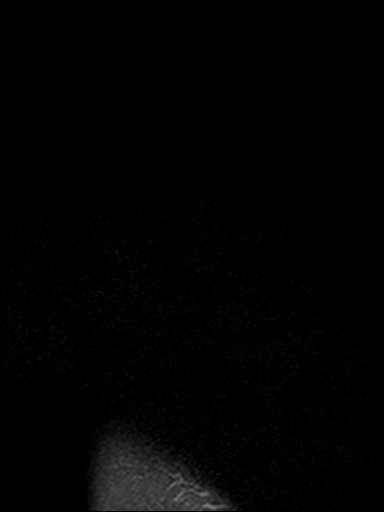
[im 11/22]
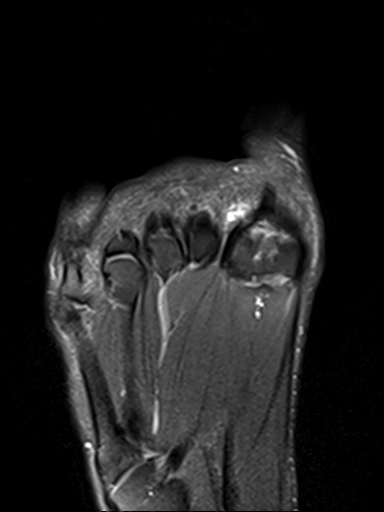
[im 18/22]
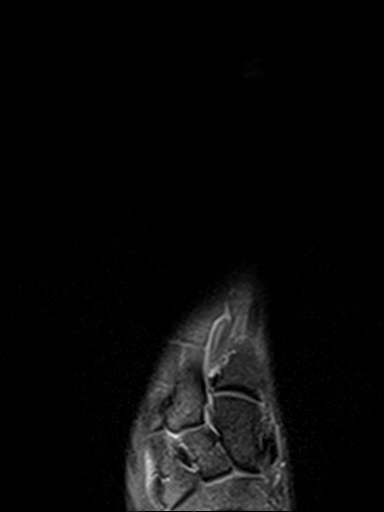

[Series 8: T1 · axial · 3.0mm · 0.35mm/px · z∈[-65,-20]mm · 3 of 13 slices shown (2 of 2)]
[im 1/13]
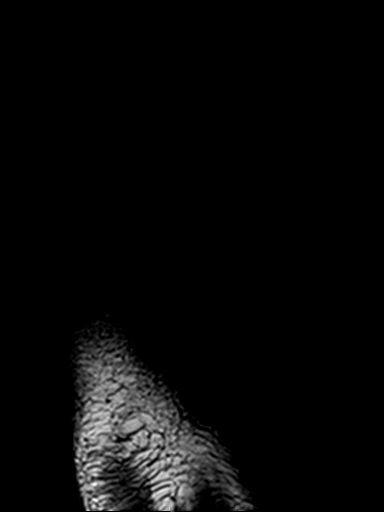
[im 9/13]
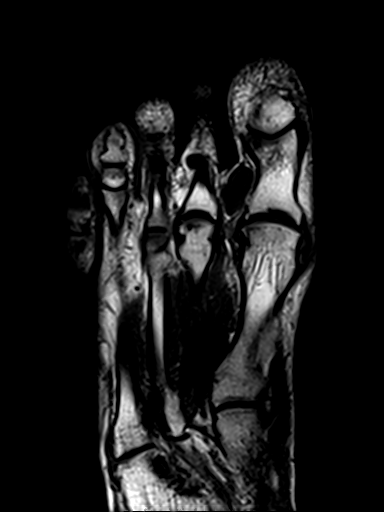
[im 13/13]
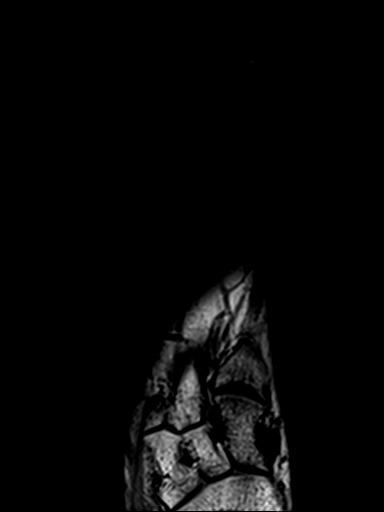

[16 of 40 positions shown; findings below may reference images not displayed]

FINDINGS: Bones/Joint/Cartilage

Mild-to-moderate thinning of the great toe metatarsophalangeal joint
cartilage with mild subchondral increased T2 signal cystic change.
Minimal peripheral great toe metatarsophalangeal joint degenerative
spurs.

Ligaments

The Lisfranc ligament complex is intact. The plantar plates appear
intact.

Muscles and Tendons

Intact.

Soft tissues

There is a marker overlying the soft tissues at the plantar medial
aspect of the great toe metatarsophalangeal joint. Deep to this
marker, centered within the subcutaneous fat, there is multilobular
decreased T1 and increased T2 likely cystic signal, measuring up to
approximately 3.8 by 2.2 by 1.1 cm (transverse by long axis of the
foot by short axis of the foot). This appears to represent a
multiloculated ganglion, which tapers towards the junction of the
great toe metatarsophalangeal joint and the medial sesamoid (coronal
series 7, image 14, axial series 6, image 16, sagittal series 9,
image 25).

There is also a lobular decreased T1 and increased T2 signal
structure centered just lateral to the proximal shaft of the great
toe proximal phalanx, with a tapered region extending towards the
plantar lateral aspect of the great toe metatarsophalangeal joint
(coronal series 7, image 11, axial series 6, images 18-20). This
measures up to approximately 0.9 by 1.9 by 1.9 cm (transverse by
long axis of the foot by short axis of the foot). This is favored to
represent a ganglion, and the neck may extend from the plantar
aspect of the great toe metatarsophalangeal joint or the
articulation of the great toe metatarsal head and the junction of
the medial and lateral sesamoids.
IMPRESSION: :
IMPRESSION: 1. In the area of interest of the soft tissues plantar and medial to
the great toe metatarsophalangeal joint and sesamoids, there is a
multiloculated likely cystic collection favored to represent a
ganglion extending from the junction of the great toe
metatarsophalangeal joint and medial sesamoid.
2. There is an additional smaller ganglion centered just lateral to
the proximal shaft of the great toe proximal phalanx, appearing to
extend from the plantar aspect of the great toe metatarsophalangeal
joint or the articulation of the great toe metatarsal head and the
junction of the medial and lateral sesamoids.
3. Mild great toe metatarsophalangeal joint osteoarthritis.
